# Patient Record
Sex: Female | Born: 2000 | Race: Black or African American | Hispanic: No | Marital: Single | Smoking: Current every day smoker
Health system: Southern US, Community
[De-identification: ages and names within clinical notes are randomized; demographics above are authoritative.]

## PROBLEM LIST (undated history)

## (undated) DIAGNOSIS — M419 Scoliosis, unspecified: Secondary | ICD-10-CM

## (undated) DIAGNOSIS — M1990 Unspecified osteoarthritis, unspecified site: Secondary | ICD-10-CM

## (undated) DIAGNOSIS — G43909 Migraine, unspecified, not intractable, without status migrainosus: Secondary | ICD-10-CM

## (undated) DIAGNOSIS — K589 Irritable bowel syndrome without diarrhea: Secondary | ICD-10-CM

## (undated) HISTORY — PX: FOOT SURGERY: SHX648

---
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
## 2018-04-13 DIAGNOSIS — B009 Herpesviral infection, unspecified: Secondary | ICD-10-CM | POA: Insufficient documentation

## 2018-05-12 ENCOUNTER — Emergency Department
Admission: EM | Admit: 2018-05-12 | Discharge: 2018-05-12 | Disposition: A | Discharge status: Home / Self Care | Payer: Medicaid Other | Attending: Emergency Medicine | Admitting: Emergency Medicine

## 2018-05-12 ENCOUNTER — Emergency Department: Payer: Medicaid Other

## 2018-05-12 ENCOUNTER — Other Ambulatory Visit: Payer: Self-pay

## 2018-05-12 ENCOUNTER — Encounter: Payer: Self-pay | Admitting: Emergency Medicine

## 2018-05-12 DIAGNOSIS — S63694A Other sprain of right ring finger, initial encounter: Secondary | ICD-10-CM | POA: Insufficient documentation

## 2018-05-12 DIAGNOSIS — Y9289 Other specified places as the place of occurrence of the external cause: Secondary | ICD-10-CM | POA: Insufficient documentation

## 2018-05-12 DIAGNOSIS — S63614A Unspecified sprain of right ring finger, initial encounter: Secondary | ICD-10-CM

## 2018-05-12 DIAGNOSIS — F17200 Nicotine dependence, unspecified, uncomplicated: Secondary | ICD-10-CM | POA: Insufficient documentation

## 2018-05-12 DIAGNOSIS — W231XXA Caught, crushed, jammed, or pinched between stationary objects, initial encounter: Secondary | ICD-10-CM | POA: Insufficient documentation

## 2018-05-12 DIAGNOSIS — Y999 Unspecified external cause status: Secondary | ICD-10-CM | POA: Insufficient documentation

## 2018-05-12 DIAGNOSIS — Y9389 Activity, other specified: Secondary | ICD-10-CM | POA: Insufficient documentation
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
## 2018-05-12 NOTE — ED Triage Notes (Signed)
Patient ambulatory to triage with steady gait, without difficulty or distress noted; pt reports injured rt 4th finger; c/o pain & swelling

## 2018-06-23 DIAGNOSIS — Z8669 Personal history of other diseases of the nervous system and sense organs: Secondary | ICD-10-CM | POA: Insufficient documentation

## 2018-06-23 LAB — HIV ANTIBODY (ROUTINE TESTING W REFLEX): HIV 1&2 Ab, 4th Generation: NONREACTIVE

## 2018-06-23 LAB — HM HEPATITIS C SCREENING LAB: HM HEPATITIS C SCREENING: NEGATIVE

## 2018-07-07 DIAGNOSIS — B009 Herpesviral infection, unspecified: Secondary | ICD-10-CM

## 2018-07-07 DIAGNOSIS — Z8669 Personal history of other diseases of the nervous system and sense organs: Secondary | ICD-10-CM

## 2018-09-19 ENCOUNTER — Other Ambulatory Visit: Payer: Self-pay

## 2018-09-19 ENCOUNTER — Emergency Department
Admission: EM | Admit: 2018-09-19 | Discharge: 2018-09-19 | Disposition: A | Discharge status: Home / Self Care | Payer: Medicaid Other | Attending: Emergency Medicine | Admitting: Emergency Medicine

## 2018-09-19 ENCOUNTER — Emergency Department: Payer: Medicaid Other

## 2018-09-19 ENCOUNTER — Encounter: Payer: Self-pay | Admitting: Emergency Medicine

## 2018-09-19 DIAGNOSIS — R05 Cough: Secondary | ICD-10-CM

## 2018-09-19 DIAGNOSIS — F17200 Nicotine dependence, unspecified, uncomplicated: Secondary | ICD-10-CM | POA: Insufficient documentation

## 2018-09-19 DIAGNOSIS — N3001 Acute cystitis with hematuria: Secondary | ICD-10-CM | POA: Insufficient documentation

## 2018-09-19 DIAGNOSIS — R059 Cough, unspecified: Secondary | ICD-10-CM

## 2018-09-19 LAB — URINALYSIS, COMPLETE (UACMP) WITH MICROSCOPIC
Bilirubin Urine: NEGATIVE
Glucose, UA: NEGATIVE mg/dL
Hgb urine dipstick: NEGATIVE
Ketones, ur: NEGATIVE mg/dL
Leukocytes,Ua: NEGATIVE
Nitrite: POSITIVE — AB
Protein, ur: 100 mg/dL — AB
Specific Gravity, Urine: 1.031 — ABNORMAL HIGH (ref 1.005–1.030)
pH: 5 (ref 5.0–8.0)

## 2018-09-19 LAB — POCT PREGNANCY, URINE: Preg Test, Ur: NEGATIVE

## 2018-09-19 MED ORDER — ONDANSETRON 4 MG PO TBDP: 4.0000 mg | ORAL_TABLET | Freq: Three times a day (TID) | ORAL | 0 refills | Status: AC | PRN

## 2018-09-19 MED ORDER — ONDANSETRON 4 MG PO TBDP
4.0000 mg | ORAL_TABLET | Freq: Once | ORAL | Status: AC
  Administered 2018-09-19: 17:00:00 4 mg via ORAL
  Filled 2018-09-19: qty 1

## 2018-09-19 MED ORDER — NITROFURANTOIN MONOHYD MACRO 100 MG PO CAPS
100.0000 mg | ORAL_CAPSULE | Freq: Once | ORAL | Status: AC
  Administered 2018-09-19: 18:00:00 100 mg via ORAL
  Filled 2018-09-19: qty 1

## 2018-09-19 MED ORDER — NITROFURANTOIN MONOHYD MACRO 100 MG PO CAPS: 100.0000 mg | ORAL_CAPSULE | Freq: Two times a day (BID) | ORAL | 0 refills | Status: AC

## 2018-09-19 NOTE — ED Triage Notes (Signed)
Patient arrives for new onset shortness of breath and sinus pressure. Patient states she becomes more SOB when she smokes cigarettes. She also states she has been having some generalized abdominal pain.

## 2018-09-19 NOTE — ED Provider Notes (Signed)
[facility] Emergency Department Provider Note  ____________________________________________  Time seen: Approximately 5:17 PM  I have reviewed the triage vital signs and the nursing notes.   HISTORY  Chief Complaint Shortness of Breath   HPI Brianna Henderson is a 18 y.o. female with a history of IBS who presents for evaluation of several medical complaints.  Patient reports that for the last 2 days she has had a cough productive of clear sputum that is only present when she smokes.  Today she walked to work and was sweating a lot which made her concerned.  Also complaining of nausea that is only present when she eats for the last 2 days.  She is also concerned that she may be pregnant since she had sex 1 time after her menstrual period with no protection.  She is also complaining of dysuria for a few days. She has had no fever, chills, chest pain or shortness of breath, abdominal pain, vaginal discharge, diarrhea.  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 Authorizing Provider  dicyclomine (BENTYL) 20 MG tablet Take 1 tablet (20 mg total) by mouth 3 (three) times daily as needed for spasms. 12/14/17 12/14/18  Darci Current, MD  nitrofurantoin, macrocrystal-monohydrate, (MACROBID) 100 MG capsule Take 1 capsule (100 mg total) by mouth 2 (two) times daily for 5 days. 09/19/18 09/24/18  Nita Sickle, MD  ondansetron (ZOFRAN ODT) 4 MG disintegrating tablet Take 1 tablet (4 mg total) by mouth every 8 (eight) hours as  needed. 09/19/18   Nita Sickle, MD    Allergies Clindamycin/lincomycin and Penicillins  History reviewed. No pertinent family history.  Social History Social History   Tobacco Use   Smoking status: Current Every Day Smoker    Packs/day: 0.25   Smokeless tobacco: Never Used  Substance Use Topics   Alcohol use: Yes   Drug use: No    Review of Systems  Constitutional: Negative for fever. Eyes: Negative for visual changes. ENT: Negative for sore throat. Neck: No neck pain  Cardiovascular: Negative for chest pain. Respiratory: Negative for shortness of breath. + cough Gastrointestinal: Negative for abdominal pain, vomiting or diarrhea. + nausea Genitourinary: Negative for dysuria. Musculoskeletal: Negative for back pain. Skin: Negative for rash. Neurological: Negative for headaches, weakness or numbness. Psych: No SI or HI  ____________________________________________   PHYSICAL EXAM:  VITAL SIGNS: ED Triage Vitals  Enc Vitals Group     BP 09/19/18 1650 (!) 149/98     Pulse Rate 09/19/18 1650 85     Resp 09/19/18 1650 18     Temp 09/19/18 1650 98.3 F (36.8 C)     Temp Source 09/19/18 1650 Oral     SpO2 09/19/18 1650 100 %     Weight 09/19/18 1651 230 lb (104.3 kg)     Height 09/19/18 1651  (1.803 m)     Head Circumference --      Peak Flow --      Pain Score 09/19/18  1650 0     Pain Loc --      Pain Edu? --      Excl. in GC? --     Constitutional: Alert and oriented. Well appearing and in no apparent distress. HEENT:      Head: Normocephalic and atraumatic.         Eyes: Conjunctivae are normal. Sclera is non-icteric.       Mouth/Throat: Mucous membranes are moist.       Neck: Supple with no signs of meningismus. Cardiovascular: Regular rate and rhythm. No murmurs, gallops, or rubs. 2+ symmetrical distal pulses are present in all extremities. No JVD. Respiratory: Normal respiratory effort. Lungs are clear to auscultation bilaterally. No  wheezes, crackles, or rhonchi.  Gastrointestinal: Soft, non tender, and non distended with positive bowel sounds. No rebound or guarding. Musculoskeletal: Nontender with normal range of motion in all extremities. No edema, cyanosis, or erythema of extremities. Neurologic: Normal speech and language. Face is symmetric. Moving all extremities. No gross focal neurologic deficits are appreciated. Skin: Skin is warm, dry and intact. No rash noted. Psychiatric: Mood and affect are normal. Speech and behavior are normal.  ____________________________________________   LABS (all labs ordered are listed, but only abnormal results are displayed)  Labs Reviewed  URINALYSIS, COMPLETE (UACMP) WITH MICROSCOPIC - Abnormal; Notable for the following components:      Result Value   Color, Urine AMBER (*)    APPearance HAZY (*)    Specific Gravity, Urine 1.031 (*)    Protein, ur 100 (*)    Nitrite POSITIVE (*)    Bacteria, UA FEW (*)    All other components within normal limits  URINE CULTURE  POCT PREGNANCY, URINE   ____________________________________________  EKG  none  ____________________________________________  RADIOLOGY  I have personally reviewed the images performed during this visit and I agree with the Radiologist's read.   Interpretation by Radiologist:  Dg Chest 1 View  Result Date: 09/19/2018 CLINICAL DATA:  Cough and shortness of breath. EXAM: CHEST  1 VIEW COMPARISON:  None. FINDINGS: The heart size and mediastinal contours are within normal limits. Both lungs are clear. The visualized skeletal structures are unremarkable. IMPRESSION: No active disease. Electronically Signed   By: Obie Dredge M.D.   On: 09/19/2018 17:40      ____________________________________________   PROCEDURES  Procedure(s) performed: None Procedures Critical Care performed:  None ____________________________________________   INITIAL IMPRESSION / ASSESSMENT AND PLAN / ED COURSE   18  y.o. female with a history of IBS who presents for evaluation of several medical complaints including cough when she smokes, nausea, dysuria.  Patient is extremely well-appearing in no distress with normal vitals, normal work of breathing, lungs are clear to auscultation, abdomen is soft with no tenderness, patient looks well-hydrated.  We will do a chest x-ray to rule out pneumonia.  UA to rule out UTI.  Pregnancy test.  Will give Zofran for nausea.  Presentation concerning for viral illness versus cough related to smoking versus allergies versus bronchitis versus pneumonia.    _________________________ 5:52 PM on 09/19/2018 -----------------------------------------  UA positive for UTI.  Negative pregnancy test.  Chest x-ray negative for pneumonia.  Patient is tolerating p.o.  Will discharge home on Macrobid and Zofran.  Discussed my standard return precautions and follow-up.   As part of my medical decision making, I reviewed the following data within the electronic MEDICAL RECORD NUMBER Nursing notes reviewed and incorporated, Labs reviewed , Notes from prior ED visits and  [place] Controlled Substance Database    Pertinent labs & imaging results that were available during my care of the patient were reviewed by me and considered in my medical decision making (see chart for details).    ____________________________________________   FINAL CLINICAL IMPRESSION(S) / ED DIAGNOSES  Final diagnoses:  Acute cystitis with hematuria      NEW MEDICATIONS STARTED DURING THIS VISIT:  ED Discharge Orders         Ordered    ondansetron (ZOFRAN ODT) 4 MG disintegrating tablet  Every 8 hours PRN     09/19/18 1751    nitrofurantoin, macrocrystal-monohydrate, (MACROBID) 100 MG capsule  2 times daily     09/19/18 1751           Note:  This document was prepared using Dragon voice recognition software and may include unintentional dictation errors.    Don PerkingVeronese, [place], MD 09/19/18 [phone]

## 2018-09-19 NOTE — Discharge Instructions (Addendum)

## 2018-09-21 LAB — URINE CULTURE: Culture: >=100000 — AB

## 2018-10-12 ENCOUNTER — Encounter: Payer: Self-pay | Admitting: Emergency Medicine

## 2018-10-12 ENCOUNTER — Other Ambulatory Visit: Payer: Self-pay

## 2018-10-12 ENCOUNTER — Emergency Department
Admission: EM | Admit: 2018-10-12 | Discharge: 2018-10-13 | Disposition: A | Discharge status: Home / Self Care | Payer: HRSA Program | Attending: Emergency Medicine | Admitting: Emergency Medicine

## 2018-10-12 DIAGNOSIS — R05 Cough: Secondary | ICD-10-CM | POA: Diagnosis present

## 2018-10-12 DIAGNOSIS — F17200 Nicotine dependence, unspecified, uncomplicated: Secondary | ICD-10-CM | POA: Diagnosis not present

## 2018-10-12 DIAGNOSIS — Z20828 Contact with and (suspected) exposure to other viral communicable diseases: Secondary | ICD-10-CM | POA: Diagnosis not present

## 2018-10-12 DIAGNOSIS — J069 Acute upper respiratory infection, unspecified: Secondary | ICD-10-CM | POA: Insufficient documentation

## 2018-10-12 NOTE — ED Triage Notes (Addendum)
Patient ambulatory to triage with steady gait, without difficulty or distress noted; pt reports prod cough white sputum & congestion x week--mask placed on pt

## 2018-10-12 NOTE — ED Notes (Signed)
Patient states she smokes marijuana at least 3x per day, and she also has allergies

## 2018-10-13 LAB — SARS CORONAVIRUS 2 BY RT PCR (HOSPITAL ORDER, PERFORMED IN ~~LOC~~ HOSPITAL LAB): SARS Coronavirus 2: NEGATIVE

## 2018-10-13 NOTE — ED Provider Notes (Signed)
[facility] Emergency Department Provider Note   First MD Initiated Contact with Patient 10/13/18 0000     (approximate)  I have reviewed the triage vital signs and the nursing notes.   HISTORY  Chief Complaint Cough    HPI Brianna Henderson is a 18 y.o. female with below list of previous medical conditions presents to the emergency department with 1 week history of generalized body aches fever, productive cough with congestion.  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       Past Medical History:  Diagnosis Date  . Degenerative joint disease   . IBS (irritable bowel syndrome)   . Migraine   . Scoliosis     Patient Active Problem List   Diagnosis Date Noted  . 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 FOOT SURGERY      Prior to Admission medications   Medication Sig Start Date End Date Taking? Authorizing Provider  dicyclomine (BENTYL) 20 MG tablet Take 1 tablet (20 mg total) by mouth 3 (three) times daily as needed for spasms. 12/14/17 12/14/18  Darci CurrentBrown, [place] N, MD  ondansetron (ZOFRAN ODT) 4 MG disintegrating tablet Take 1 tablet (4 mg total) by mouth every 8 (eight) hours as needed. 09/19/18   Nita SickleVeronese, [place], MD    Allergies Clindamycin/lincomycin and Penicillins  No family history on file.  Social History Social History   Tobacco Use  . Smoking status: Current Every Day Smoker    Packs/day: 0.25  . Smokeless tobacco: Never Used  Substance Use Topics  . Alcohol use: Yes  . Drug use: No    Review of Systems Constitutional: No fever/chills Eyes: No visual changes. ENT: No sore throat.  Positive for nasal congestion Cardiovascular:  Denies chest pain. Respiratory: Denies shortness of breath.  Positive cough Gastrointestinal: No abdominal pain.  No nausea, no vomiting.  No diarrhea.  No constipation. Genitourinary: Negative for dysuria. Musculoskeletal: Negative for neck pain.  Negative for back pain. Integumentary: Negative for rash. Neurological: Negative for headaches, focal weakness or numbness.   ____________________________________________   PHYSICAL EXAM:  VITAL SIGNS: ED Triage Vitals  Enc Vitals Group     BP 10/12/18 2325 127/74     Pulse Rate 10/12/18 2325 62     Resp 10/12/18 2325 16     Temp 10/12/18 2325 98.2 F (36.8 C)     Temp Source 10/12/18 2325 Oral     SpO2 10/12/18 2325 99 %     Weight 10/12/18 2324 102.5 kg (226 lb)     Height 10/12/18 2324 1.829 m (6')     Head Circumference --      Peak Flow --      Pain Score 10/12/18 2326 0     Pain Loc --      Pain Edu? --      Excl. in GC? --      Constitutional: Alert and oriented. Well appearing and in no acute distress. Eyes: Conjunctivae are normal.  Head: Atraumatic. Nose: No congestion/rhinnorhea. Mouth/Throat: Mucous membranes are moist.  Oropharynx non-erythematous. Neck: No stridor.  No meningeal signs.   Cardiovascular: Normal rate, regular rhythm. Good peripheral circulation. Grossly normal heart sounds. Respiratory:  Normal respiratory effort.  No retractions. No audible wheezing. Gastrointestinal: Soft and nontender. No distention. Musculoskeletal: No lower extremity tenderness nor edema. No gross deformities of extremities. Neurologic:  Normal speech and language. No gross focal neurologic deficits are appreciated.  Skin:  Skin is warm, dry and intact. No rash noted. Psychiatric: Mood and affect are normal. Speech and behavior are normal.  ____________________________________________   LABS (all labs ordered are listed, but only abnormal results are displayed)  Labs Reviewed  SARS CORONAVIRUS 2 (HOSPITAL ORDER,  PERFORMED IN [place] [facility])     Procedures   ____________________________________________   INITIAL IMPRESSION / MDM / ASSESSMENT AND PLAN / ED COURSE  As part of my medical decision making, I reviewed the following data within the electronic MEDICAL RECORD NUMBER   18 year old female presenting with above-stated history and physical exam concerning for possible URI however considered possibly of COVID-19 in such a take patient was tested in the emergency department.  COVID-19 testing negative.       ____________________________________________  FINAL CLINICAL IMPRESSION(S) / ED DIAGNOSES  Final diagnoses:  Upper respiratory tract infection, unspecified type     MEDICATIONS GIVEN DURING THIS VISIT:  Medications - No data to display   ED Discharge Orders    None       Note:  This document was prepared using Dragon voice recognition software and may include unintentional dictation errors.   Darci Current, MD 10/13/18 [phone]

## 2018-11-08 ENCOUNTER — Ambulatory Visit: Payer: Self-pay | Admitting: *Deleted

## 2018-11-08 NOTE — Telephone Encounter (Signed)
Patient with additional menstrual bleeding just 7 days after her normal light and short lasting cycle. She has been bleeding more heavily nad with abdominal cramping and back aches for 7 days now. Had unprotected sex  and took Plan B within 72 hours then started bleeding again on 4 days later. Denies dizziness. No contact reported no travels. No fever. No PCP and no insurance. Recommended appointment or ED visit today. Stated she would.   Reason for Disposition . MODERATE vaginal bleeding (i.e., soaking 1 pad or tampon per hour and present > 6 hours; 1 menstrual cup every 6 hours)  Answer Assessment - Initial Assessment Questions 1. AMOUNT: "Describe the bleeding that you are having."    - SPOTTING: spotting, or pinkish / brownish mucous discharge; does not fill panti-liner or pad   - MILD:  less than 1 pad / hour; less than patient's usual menstrual bleeding   - MODERATE: 1-2 pads / hour; 1 menstrual cup every 6 hours; small-medium blood clots (e.g., pea, grape, small coin)   - SEVERE: soaking 2 or more pads/hour for 2 or more hours; 1 menstrual cup every 2 hours; bleeding not contained by pads or continuous red blood from vagina; large blood clots (e.g., golf ball, large coin)      Dark red bleeding. Changing 3x daily pads 2. ONSET: "When did the bleeding begin?" "Is it continuing now?"     Started on 6/4 and continues now 3. MENSTRUAL PERIOD: "When was the last normal menstrual period?" "How is this different than your period?"     One week before this bleeding started.Stopped on a Thursday/Friday then started back the next Thursday. 4. REGULARITY: "How regular are your periods?"     Normally, light, and last only 4 days  5. ABDOMINAL PAIN: "Do you have any pain?" "How bad is the pain?"  (e.g., Scale 1-10; mild, moderate, or severe)   - MILD (1-3): doesn't interfere with normal activities, abdomen soft and not tender to touch    - MODERATE (4-7): interferes with normal activities or awakens  from sleep, tender to touch    - SEVERE (8-10): excruciating pain, doubled over, unable to do any normal activities      5-6 with cramping and back aches 6. PREGNANCY: "Could you be pregnant?" "Are you sexually active?" "Did you recently give birth?"     Negative pregnancy test around 6/6. Took plan B 7. BREASTFEEDING: "Are you breastfeeding?"     na 8. HORMONES: "Are you taking any hormone medications, prescription or OTC?" (e.g., birth control pills, estrogen)     no 9. BLOOD THINNERS: "Do you take any blood thinners?" (e.g., Coumadin/warfarin, Pradaxa/dabigatran, aspirin)     no 10. CAUSE: "What do you think is causing the bleeding?" (e.g., recent gyn surgery, recent gyn procedure; known bleeding disorder, cervical cancer, polycystic ovarian disease, fibroids)        No idea 11. HEMODYNAMIC STATUS: "Are you weak or feeling lightheaded?" If so, ask: "Can you stand and walk normally?"        no 12. OTHER SYMPTOMS: "What other symptoms are you having with the bleeding?" (e.g., passed tissue, vaginal discharge, fever, menstrual-type cramps)       cramps  Protocols used: VAGINAL BLEEDING - ABNORMAL-A-AH

## 2018-11-09 ENCOUNTER — Emergency Department
Admission: EM | Admit: 2018-11-09 | Discharge: 2018-11-10 | Disposition: A | Discharge status: Home / Self Care | Payer: Medicaid Other | Attending: Emergency Medicine | Admitting: Emergency Medicine

## 2018-11-09 ENCOUNTER — Encounter: Payer: Self-pay | Admitting: Emergency Medicine

## 2018-11-09 ENCOUNTER — Emergency Department: Payer: Medicaid Other

## 2018-11-09 DIAGNOSIS — Y999 Unspecified external cause status: Secondary | ICD-10-CM | POA: Insufficient documentation

## 2018-11-09 DIAGNOSIS — S90852A Superficial foreign body, left foot, initial encounter: Secondary | ICD-10-CM | POA: Insufficient documentation

## 2018-11-09 DIAGNOSIS — Y929 Unspecified place or not applicable: Secondary | ICD-10-CM | POA: Insufficient documentation

## 2018-11-09 DIAGNOSIS — Y9389 Activity, other specified: Secondary | ICD-10-CM | POA: Insufficient documentation

## 2018-11-09 DIAGNOSIS — Z23 Encounter for immunization: Secondary | ICD-10-CM | POA: Insufficient documentation

## 2018-11-09 DIAGNOSIS — Z79899 Other long term (current) drug therapy: Secondary | ICD-10-CM | POA: Insufficient documentation

## 2018-11-09 DIAGNOSIS — F17210 Nicotine dependence, cigarettes, uncomplicated: Secondary | ICD-10-CM | POA: Insufficient documentation

## 2018-11-09 DIAGNOSIS — W458XXA Other foreign body or object entering through skin, initial encounter: Secondary | ICD-10-CM | POA: Insufficient documentation

## 2018-11-09 MED ORDER — TETANUS-DIPHTH-ACELL PERTUSSIS 5-2.5-18.5 LF-MCG/0.5 IM SUSP
0.5000 mL | Freq: Once | INTRAMUSCULAR | Status: AC
  Administered 2018-11-09: 0.5 mL via INTRAMUSCULAR

## 2018-11-09 MED ORDER — OXYCODONE HCL 5 MG PO TABS
10.0000 mg | ORAL_TABLET | Freq: Once | ORAL | Status: AC
  Administered 2018-11-09: 23:00:00 10 mg via ORAL
  Filled 2018-11-09: qty 2

## 2018-11-09 MED ORDER — LIDOCAINE HCL (PF) 1 % IJ SOLN
5.0000 mL | Freq: Once | INTRAMUSCULAR | Status: AC
  Administered 2018-11-09: 5 mL via INTRADERMAL
  Filled 2018-11-09: qty 5

## 2018-11-09 NOTE — ED Provider Notes (Signed)
[facility] Emergency Department Provider Note  ____________________________________________  Time seen: Approximately 10:38 PM  I have reviewed the triage vital signs and the nursing notes.   HISTORY  Chief Complaint Foot Pain   HPI Brianna Henderson is a 18 y.o. female who presents to the ER for removal of foreign body from the left foot.  Patient states that she had a pair tweezers in her back pocket and when she took her pants off to put her pajamas on, the tweezers fell out of the pocket and she stepped back and jammed the tweezers in her foot.  Tdap is not current.  Past Medical History:  Diagnosis Date  . Degenerative joint disease   . IBS (irritable bowel syndrome)   . Migraine   . Scoliosis     Patient Active Problem List   Diagnosis Date Noted  . 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 FOOT SURGERY      Prior to Admission medications   Medication Sig Start Date End Date Taking? Authorizing Provider  dicyclomine (BENTYL) 20 MG tablet Take 1 tablet (20 mg total) by mouth 3 (three) times daily as needed for spasms. 12/14/17 12/14/18  Gregor Hams, MD  ondansetron (ZOFRAN ODT) 4 MG disintegrating tablet Take 1 tablet (4 mg total) by mouth every 8 (eight) hours as needed. 09/19/18   Rudene Re, MD    Allergies Clindamycin/lincomycin and Penicillins  History reviewed. No pertinent family history.  Social History Social History   Tobacco Use  . Smoking status: Current Every Day Smoker    Packs/day: 0.25  . Smokeless tobacco: Never Used  Substance Use Topics  . Alcohol use: Yes  . Drug use: No    Review of Systems  Constitutional: Negative for fever. Respiratory: Negative for cough or shortness of breath.  Musculoskeletal: Negative for myalgias Skin: Positive for foreign body in the left foot Neurological: Negative for  numbness or paresthesias. ____________________________________________   PHYSICAL EXAM:  VITAL SIGNS: ED Triage Vitals [11/09/18 2209]  Enc Vitals Group     BP (!) 149/89     Pulse Rate (!) 110     Resp 20     Temp 98 F (36.7 C)     Temp Source Oral     SpO2 99 %     Weight      Height      Head Circumference      Peak Flow      Pain Score      Pain Loc      Pain Edu?      Excl. in [place]?      Constitutional: Well appearing. Eyes: Conjunctivae are clear without discharge or drainage. Nose: No rhinorrhea noted. Mouth/Throat: Airway is patent.  Neck: No stridor. Unrestricted range of motion observed. Cardiovascular: Capillary refill is <3 seconds.  Respiratory: Respirations are even and unlabored.. Musculoskeletal: Unrestricted range of motion observed. Neurologic: Awake, alert, and oriented x 4.  Skin: Tweezers protruding from the bottom of the left foot  ____________________________________________   LABS (all labs ordered are listed, but only abnormal results are displayed)  Labs Reviewed - No data to display ____________________________________________  EKG  Not indicated. ____________________________________________  RADIOLOGY  Image of the left foot shows no acute bony abnormality.  There is a metallic foreign body projecting over the calcaneus. ____________________________________________   PROCEDURES  .Foreign Body Removal  Date/Time: 11/10/2018  1:08 AM Performed by: Chinita Pesterriplett, Chrystle Murillo B, FNP Authorized by: Chinita Pesterriplett, Preslyn Warr B, FNP  Consent: Verbal consent obtained. Consent given by: patient Patient understanding: patient states understanding of the procedure being performed Imaging studies: imaging studies available Body area: skin General location: lower extremity Location details: left foot Anesthesia: local infiltration  Anesthesia: Local Anesthetic: lidocaine 1% without epinephrine Anesthetic total: 4 mL Localization method:  visualized Dressing: dressing applied Depth: deep Complexity: simple 1 objects recovered. Objects recovered: Tweezers Post-procedure assessment: foreign body removed   ____________________________________________   INITIAL IMPRESSION / ASSESSMENT AND PLAN / ED COURSE  Brianna Henderson is a 18 y.o. female who presents to the emergency department for removal of tweezers from the bottom of her left foot.  See above for procedure details.  Patient tolerated the procedure well.  She was given ER return precautions.  Wound care instructions were also discussed.   Medications  lidocaine (PF) (XYLOCAINE) 1 % injection 5 mL (5 mLs Intradermal Given 11/09/18 2258)  oxyCODONE (Oxy IR/ROXICODONE) immediate release tablet 10 mg (10 mg Oral Given 11/09/18 2257)  Tdap (BOOSTRIX) injection 0.5 mL (0.5 mLs Intramuscular Given 11/09/18 2350)     Pertinent labs & imaging results that were available during my care of the patient were reviewed by me and considered in my medical decision making (see chart for details).  ____________________________________________   FINAL CLINICAL IMPRESSION(S) / ED DIAGNOSES  Final diagnoses:  Foreign body in left foot, initial encounter    ED Discharge Orders    None       Note:  This document was prepared using Dragon voice recognition software and may include unintentional dictation errors.   Chinita Pesterriplett, Jameon Deller B, FNP 11/10/18 0110    Jeanmarie PlantMcShane, James A, MD 11/10/18 [phone]

## 2018-11-09 NOTE — ED Triage Notes (Addendum)
Pt with tweezers stuck in the left foot.

## 2018-11-09 NOTE — Discharge Instructions (Signed)
Keep the wound clean and dry.  Use soap and water to clean it then apply antibiotic ointment 2 times per day.  Return to the ER for sign or concern of infection.

## 2018-12-11 ENCOUNTER — Encounter: Payer: Self-pay | Admitting: Physician Assistant

## 2019-01-04 ENCOUNTER — Other Ambulatory Visit: Payer: Self-pay

## 2019-01-04 ENCOUNTER — Ambulatory Visit: Payer: Medicaid Other

## 2019-01-04 ENCOUNTER — Encounter: Payer: Self-pay | Admitting: Physician Assistant

## 2019-01-04 ENCOUNTER — Ambulatory Visit (LOCAL_COMMUNITY_HEALTH_CENTER): Payer: Medicaid Other | Admitting: Physician Assistant

## 2019-01-04 VITALS — BP 97/66 | Ht 70.0 in | Wt 215.8 lb

## 2019-01-04 DIAGNOSIS — Z3009 Encounter for other general counseling and advice on contraception: Secondary | ICD-10-CM | POA: Diagnosis not present

## 2019-01-04 LAB — WET PREP FOR TRICH, YEAST, CLUE
Trichomonas Exam: NEGATIVE
Yeast Exam: NEGATIVE

## 2019-01-04 MED ORDER — ETONOGESTREL-ETHINYL ESTRADIOL 0.12-0.015 MG/24HR VA RING: VAGINAL_RING | VAGINAL | 3 refills | Status: AC

## 2019-01-04 NOTE — Progress Notes (Signed)
Family Planning Visit- Repeat Yearly Visit  Subjective:  Brianna Henderson is a 18 y.o. being seen today for an well woman visit and to discuss family planning options.    She is currently using none for pregnancy prevention. Patient reports she does not want a pregnancy in the next year. Patient  has History of migraine with aura and HSV-2 infection on their problem list. Smokes 1-2 cig per day, and not ready to quit. No STI sx, but just ended a relationship and wants STD screening today.  Chief Complaint  Patient presents with  . Contraception    Wants Teacher, early years/pre    See flowsheet for other program required questions.   Body mass index is 30.96 kg/m. - Patient is eligible for diabetes screening based on BMI and age >78?  not applicable LF8B ordered? not applicable  Patient reports 1 of partners in last year. Desires STI screening?  Yes  Does the patient have a current or past history of drug use? No, aside from occ marijuana.   No components found for: HCV]   Health Maintenance Due  Topic Date Due  . HIV Screening  09/27/2013  . TETANUS/TDAP  09/27/2017  . INFLUENZA VACCINE  12/30/2018    Review of Systems  Constitutional: Negative.   Eyes: Negative.   Respiratory: Negative.   Cardiovascular: Negative.   Genitourinary: Negative.   Skin: Negative.   Neurological: Negative.   Psychiatric/Behavioral: Negative.     The following portions of the patient's history were reviewed and updated as appropriate: allergies, current medications, past family history, past medical history, past social history, past surgical history and problem list. Problem list updated.  Objective:   Vitals:   01/04/19 1416  BP: 97/66  Weight: 215 lb 12.8 oz (97.9 kg)  Height: 5\' 10"  (1.778 m)    Physical Exam Constitutional:      Appearance: Normal appearance.  Genitourinary:    General: Normal vulva.     Rectum: Normal.  Skin:    General: Skin is warm and dry.  Neurological:    Mental Status: She is alert.  Psychiatric:        Mood and Affect: Mood normal.        Thought Content: Thought content normal.       Assessment and Plan:  Brianna Henderson is a 18 y.o. female presenting to the [facility] Department for an initial well woman exam/family planning visit.   Contraception counseling: Reviewed all forms of birth control options available including abstinence; over the counter/barrier methods; hormonal contraceptive medication including pill, patch, ring, injection,contraceptive implant; hormonal and nonhormonal IUDs; permanent sterilization options including vasectomy and the various tubal sterilization modalities. Risks and benefits reviewed.  Questions were answered.  Written information was also given to the patient to review.  Patient desires NuvaRing, this was prescribed for patient. She will follow up in  11 mo for surveillance.  She was told to call with any further questions, or with any concerns about this method of contraception.  Emphasized use of condoms 100% of the time for STI prevention.  1. Family planning services Extensive discussion of BCM options; had headaches that she identifies with migraines until she discontinued Nexplanon last fall. No HA since. Counseled re: theoretical inc risk of headaches and CVA in pt with hx migraine and tobacco use; pt states understanding of risks and requests NuvaRing. - WET PREP FOR [place], YEAST, CLUE - HIV [facility] - Syphilis Serology, [facility] -  Chlamydia/Gonorrhea [facility]     Return in about 11 months (around 12/04/2019) for annual well-woman exam, Pap and f/u contraception.  No future appointments.  Landry DykeAnnamarie Kostas Marrow, PA-C

## 2019-01-04 NOTE — Progress Notes (Signed)
Here today for Contraception and STD testing. Would like to discuss Nuva Ring for birth control. Hal Morales, RN

## 2019-01-05 ENCOUNTER — Ambulatory Visit: Payer: Medicaid Other

## 2019-02-01 ENCOUNTER — Other Ambulatory Visit: Payer: Self-pay

## 2019-02-01 ENCOUNTER — Ambulatory Visit: Payer: Medicaid Other

## 2019-02-01 DIAGNOSIS — Z20822 Contact with and (suspected) exposure to covid-19: Secondary | ICD-10-CM

## 2019-02-02 LAB — NOVEL CORONAVIRUS, NAA: SARS-CoV-2, NAA: NOT DETECTED

## 2019-02-08 ENCOUNTER — Encounter: Payer: Self-pay | Admitting: Emergency Medicine

## 2019-02-08 ENCOUNTER — Emergency Department: Payer: Medicaid Other

## 2019-02-08 ENCOUNTER — Other Ambulatory Visit: Payer: Self-pay

## 2019-02-08 DIAGNOSIS — N6012 Diffuse cystic mastopathy of left breast: Secondary | ICD-10-CM | POA: Insufficient documentation

## 2019-02-08 DIAGNOSIS — Z79899 Other long term (current) drug therapy: Secondary | ICD-10-CM | POA: Insufficient documentation

## 2019-02-08 DIAGNOSIS — F17200 Nicotine dependence, unspecified, uncomplicated: Secondary | ICD-10-CM | POA: Insufficient documentation

## 2019-02-08 LAB — BASIC METABOLIC PANEL
Anion gap: 9 (ref 5–15)
BUN: 11 mg/dL (ref 6–20)
CO2: 25 mmol/L (ref 22–32)
Calcium: 9.3 mg/dL (ref 8.9–10.3)
Chloride: 105 mmol/L (ref 98–111)
Creatinine, Ser: 0.91 mg/dL (ref 0.44–1.00)
GFR calc Af Amer: >60 mL/min (ref >60)
GFR calc non Af Amer: >60 mL/min (ref >60)
Glucose, Bld: 96 mg/dL (ref 70–99)
Potassium: 3.7 mmol/L (ref 3.5–5.1)
Sodium: 139 mmol/L (ref 135–145)

## 2019-02-08 LAB — POCT PREGNANCY, URINE: Preg Test, Ur: NEGATIVE

## 2019-02-08 LAB — CBC
HCT: 35.1 % — ABNORMAL LOW (ref 36.0–46.0)
Hemoglobin: 11.7 g/dL — ABNORMAL LOW (ref 12.0–15.0)
MCH: 29 pg (ref 26.0–34.0)
MCHC: 33.3 g/dL (ref 30.0–36.0)
MCV: 87.1 fL (ref 80.0–100.0)
Platelets: 206 10*3/uL (ref 150–400)
RBC: 4.03 MIL/uL (ref 3.87–5.11)
RDW: 15.1 % (ref 11.5–15.5)
WBC: 5.3 10*3/uL (ref 4.0–10.5)
nRBC: 0 % (ref 0.0–0.2)

## 2019-02-08 LAB — TROPONIN I (HIGH SENSITIVITY): Troponin I (High Sensitivity): <2 ng/L (ref <18)

## 2019-02-08 NOTE — ED Triage Notes (Signed)
Patient in via POV, reports intermittent chest pain with radiation to left shoulder x 4-5 days, denies any associated symptoms, NAD noted at this time.

## 2019-02-09 ENCOUNTER — Emergency Department
Admission: EM | Admit: 2019-02-09 | Discharge: 2019-02-09 | Disposition: A | Discharge status: Home / Self Care | Payer: Medicaid Other | Attending: Emergency Medicine | Admitting: Emergency Medicine

## 2019-02-09 DIAGNOSIS — N6012 Diffuse cystic mastopathy of left breast: Secondary | ICD-10-CM

## 2019-02-09 DIAGNOSIS — N644 Mastodynia: Secondary | ICD-10-CM

## 2019-02-09 MED ORDER — IBUPROFEN 800 MG PO TABS: 800.0000 mg | ORAL_TABLET | Freq: Three times a day (TID) | ORAL | 0 refills | Status: AC | PRN

## 2019-02-09 NOTE — ED Provider Notes (Signed)
[facility] Emergency Department Provider Note    First MD Initiated Contact with Patient 02/09/19 0319     (approximate)  I have reviewed the triage vital signs and the nursing notes.   HISTORY  Chief Complaint Chest Pain   HPI Brianna Henderson is a 18 y.o. female with below list of previous medical conditions presents to the emergency department secondary to midline chest discomfort with "tender knots on my breast" x5 days.  Patient denies any dyspnea no nausea or vomiting.  Patient denies any diaphoresis.  Patient denies any lower extremity pain or swelling.  Patient states that she is noted the tender areas on her breast for a "long time".  Pain is improved with aspirin        Past Medical History:  Diagnosis Date  . Degenerative joint disease   . IBS (irritable bowel syndrome)   . Migraine   . Scoliosis     Patient Active Problem List   Diagnosis Date Noted  . 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 FOOT SURGERY      Prior to Admission medications   Medication Sig Start Date End Date Taking? Authorizing Provider  dicyclomine (BENTYL) 20 MG tablet Take 1 tablet (20 mg total) by mouth 3 (three) times daily as needed for spasms. 12/14/17 12/14/18  Gregor Hams, MD  etonogestrel-ethinyl estradiol (NUVARING) 0.12-0.015 MG/24HR vaginal ring Insert vaginally and leave in place for 3 consecutive weeks, then remove for 1 week. 01/04/19   Streilein, Annamarie, PA-C  ondansetron (ZOFRAN ODT) 4 MG disintegrating tablet Take 1 tablet (4 mg total) by mouth every 8 (eight) hours as needed. 09/19/18   Rudene Re, MD    Allergies Clindamycin/lincomycin and Penicillins  No family history on file.  Social History Social History   Tobacco Use  . Smoking status: Current Every Day Smoker    Packs/day: 0.10  . Smokeless tobacco: Never Used   Substance Use Topics  . Alcohol use: Yes  . Drug use: Yes    Types: Marijuana    Review of Systems Constitutional: No fever/chills Eyes: No visual changes. ENT: No sore throat. Cardiovascular: Positive for chest/breast pain Respiratory: Denies shortness of breath. Gastrointestinal: No abdominal pain.  No nausea, no vomiting.  No diarrhea.  No constipation. Genitourinary: Negative for dysuria. Musculoskeletal: Negative for neck pain.  Negative for back pain. Integumentary: Negative for rash. Neurological: Negative for headaches, focal weakness or numbness.  ____________________________________________   PHYSICAL EXAM:  VITAL SIGNS: ED Triage Vitals [02/08/19 2035]  Enc Vitals Group     BP 138/73     Pulse Rate 71     Resp 16     Temp 99.1 F (37.3 C)     Temp Source Oral     SpO2 98 %     Weight 95.3 kg (210 lb)     Height 1.829 m (6')     Head Circumference      Peak Flow      Pain Score 8     Pain Loc      Pain Edu?      Excl. in [place]?     Constitutional: Alert and oriented.  Eyes: Conjunctivae are normal.  Mouth/Throat: Mucous membranes are moist. Neck: No stridor.  No meningeal signs.   Breast: Multiple tender palpable mobile nodular mass noted throughout the breasts.  (Breast exam was performed with a  female nurse at bedside) Cardiovascular: Normal rate, regular rhythm. Good peripheral circulation. Grossly normal heart sounds. Respiratory: Normal respiratory effort.  No retractions. Gastrointestinal: Soft and nontender. No distention.  Musculoskeletal: No lower extremity tenderness nor edema. No gross deformities of extremities. Neurologic:  Normal speech and language. No gross focal neurologic deficits are appreciated.  Skin:  Skin is warm, dry and intact. Psychiatric: Mood and affect are normal. Speech and behavior are normal.  ____________________________________________   LABS (all labs ordered are listed, but only abnormal results are displayed)   Labs Reviewed  CBC - Abnormal; Notable for the following components:      Result Value   Hemoglobin 11.7 (*)    HCT 35.1 (*)    All other components within normal limits  BASIC METABOLIC PANEL  POC URINE PREG, ED  POCT PREGNANCY, URINE  TROPONIN I (HIGH SENSITIVITY)   ____________________________________________  EKG  ED ECG REPORT I, [place] N Emileo Semel, the attending physician, personally viewed and interpreted this ECG.   Date: 02/08/2019  EKG Time: 8:28 PM  Rate: 68  Rhythm: Normal sinus rhythm  Axis: Normal  Intervals: Normal  ST&T Change: None  ____________________________________________  RADIOLOGY I, [place] N Kento Gossman, personally viewed and evaluated these images (plain radiographs) as part of my medical decision making, as well as reviewing the written report by the radiologist.  ED MD interpretation: No acute abnormality noted on chest x-ray per radiologist  Official radiology report(s): Dg Chest 2 View  Result Date: 02/08/2019 CLINICAL DATA:  Chest pain EXAM: CHEST - 2 VIEW COMPARISON:  09/19/2018 FINDINGS: The heart size and mediastinal contours are within normal limits. Both lungs are clear. The visualized skeletal structures are unremarkable. IMPRESSION: No acute abnormality of the lungs. Electronically Signed   By: Lauralyn PrimesAlex  Bibbey M.D.   On: 02/08/2019 21:10    _______________________________________ Procedures   ____________________________________________   INITIAL IMPRESSION / MDM / ASSESSMENT AND PLAN / ED COURSE  As part of my medical decision making, I reviewed the following data within the electronic MEDICAL RECORD NUMBER   [age] year old female presented with above-stated history and physical exam consistent with possible fibrocystic breast.  Patient will be prescribed ibuprofen and referred to OB/GYN for further outpatient evaluation.  ____________________________________________  FINAL CLINICAL IMPRESSION(S) / ED DIAGNOSES  Final diagnoses:  Breast  pain  Fibrocystic disease of left breast     MEDICATIONS GIVEN DURING THIS VISIT:  Medications - No data to display   ED Discharge Orders    None      *Please note:  Joya GaskinsQuinesha Makyia Brue was evaluated in Emergency Department on 02/09/2019 for the symptoms described in the history of present illness. She was evaluated in the context of the global COVID-19 pandemic, which necessitated consideration that the patient might be at risk for infection with the SARS-CoV-2 virus that causes COVID-19. Institutional protocols and algorithms that pertain to the evaluation of patients at risk for COVID-19 are in a state of rapid change based on information released by regulatory bodies including the CDC and federal and state organizations. These policies and algorithms were followed during the patient's care in the ED.  Some ED evaluations and interventions may be delayed as a result of limited staffing during the pandemic.*  Note:  This document was prepared using Dragon voice recognition software and may include unintentional dictation errors.   Darci CurrentBrown, [place] N, MD 02/09/19 [phone]

## 2019-02-13 ENCOUNTER — Encounter: Payer: Self-pay | Admitting: Obstetrics and Gynecology

## 2019-02-13 ENCOUNTER — Other Ambulatory Visit: Payer: Self-pay

## 2019-02-13 ENCOUNTER — Ambulatory Visit (INDEPENDENT_AMBULATORY_CARE_PROVIDER_SITE_OTHER): Payer: Medicaid Other | Admitting: Obstetrics and Gynecology

## 2019-02-13 VITALS — BP 116/75 | HR 73 | Wt 212.8 lb

## 2019-02-13 DIAGNOSIS — R0789 Other chest pain: Secondary | ICD-10-CM

## 2019-02-13 DIAGNOSIS — R079 Chest pain, unspecified: Secondary | ICD-10-CM

## 2019-02-13 NOTE — Progress Notes (Signed)
HPI:      [name] is a 18 y.o. No obstetric history on file. who LMP was Patient's last menstrual period was 01/16/2019 (approximate).  Subjective:   She presents today as a follow-up on the emergency department.  She states that she has had breast lumps and pain over the last several years.  She says it was significantly worse last week when she went to the ED.  They found no issues with her breast and suggested that it was possibly related to her musculoskeletal system. Since that time the pain has improved and is better with Tylenol use. Of significant note patient has normal regular cycles.  She is not using anything for birth control. She states that her history of headaches resolved this and she had her Implanon removed    Hx: The following portions of the patient's history were reviewed and updated as appropriate:             She  has a past medical history of Degenerative joint disease, IBS (irritable bowel syndrome), Migraine, and Scoliosis. She does not have any pertinent problems on file. She  has a past surgical history that includes Foot surgery. Her family history is not on file. She  reports that she has been smoking. She has been smoking about 0.10 packs per day. She has never used smokeless tobacco. She reports current alcohol use. She reports current drug use. Drug: Marijuana. She has a current medication list which includes the following prescription(s): ibuprofen, etonogestrel-ethinyl estradiol, and ondansetron. She is allergic to clindamycin/lincomycin and penicillins.       Review of Systems:  Review of Systems  Constitutional: Denied constitutional symptoms, night sweats, recent illness, fatigue, fever, insomnia and weight loss.  Eyes: Denied eye symptoms, eye pain, photophobia, vision change and visual disturbance.  Ears/Nose/Throat/Neck: Denied ear, nose, throat or neck symptoms, hearing loss, nasal discharge, sinus congestion and sore throat.   Cardiovascular: Denied cardiovascular symptoms, arrhythmia, chest pain/pressure, edema, exercise intolerance, orthopnea and palpitations.  Respiratory: Denied pulmonary symptoms, asthma, pleuritic pain, productive sputum, cough, dyspnea and wheezing.  Gastrointestinal: Denied, gastro-esophageal reflux, melena, nausea and vomiting.  Genitourinary: See HPI for additional information.  Musculoskeletal: Denied musculoskeletal symptoms, stiffness, swelling, muscle weakness and myalgia.  Dermatologic: Denied dermatology symptoms, rash and scar.  Neurologic: Denied neurology symptoms, dizziness, headache, neck pain and syncope.  Psychiatric: Denied psychiatric symptoms, anxiety and depression.  Endocrine: Denied endocrine symptoms including hot flashes and night sweats.   Meds:   Current Outpatient Medications on File Prior to Visit  Medication Sig Dispense Refill  . ibuprofen (ADVIL) 800 MG tablet Take 1 tablet (800 mg total) by mouth every 8 (eight) hours as needed. 30 tablet 0  . etonogestrel-ethinyl estradiol (NUVARING) 0.12-0.015 MG/24HR vaginal ring Insert vaginally and leave in place for 3 consecutive weeks, then remove for 1 week. (Patient not taking: Reported on 02/13/2019) 3 each 3  . ondansetron (ZOFRAN ODT) 4 MG disintegrating tablet Take 1 tablet (4 mg total) by mouth every 8 (eight) hours as needed. (Patient not taking: Reported on 02/13/2019) 20 tablet 0   No current facility-administered medications on file prior to visit.     Objective:     Vitals:   02/13/19 1131  BP: 116/75  Pulse: 73              Bilateral breast examination reveals no dominant masses no axillary adenopathy no nipple discharge.  In conjunction with the patient we have localized the pain she is  having to an area that is not over lied by breast.  Her pain is localized to ribs and costochondral junction.  No masses are palpated in this area and the pain can be reproduced by gentle palpation.  Assessment:     No obstetric history on file. Patient Active Problem List   Diagnosis Date Noted  . 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 Chest pain of uncertain etiology     Likely musculoskeletal in nature-costochondral junction specifically.   Plan:            1.  Recommend use of NSAID S in lieu of Tylenol.  Rest from active work using pectoralis muscles. Orders No orders of the defined types were placed in this encounter.   No orders of the defined types were placed in this encounter.     F/U  Return for Pt to contact us if symptoms worsen. I spent 22 minutes involved in the care of this patient of which greater than 50% was spent discussing chest pain, breast issues, cycle issues, use of OCPs and other forms of birth control.  All questions answered.  Elonda Huskyavid J. Evans, M.D. 02/13/2019 12:05 PM

## 2019-02-13 NOTE — Progress Notes (Signed)
Patient comes in today for ED follow up. She has knots in breast that causes pain.

## 2019-02-14 ENCOUNTER — Ambulatory Visit: Payer: Medicaid Other

## 2019-02-15 ENCOUNTER — Ambulatory Visit: Payer: Medicaid Other

## 2019-09-26 IMAGING — DX LEFT FOOT - COMPLETE 3+ VIEW
3 series · 3 of 3 positions shown · non-contrast
Comparison: None.

CLINICAL DATA: Foot injury

EXAM:
LEFT FOOT - COMPLETE 3+ VIEW

[foot ap]
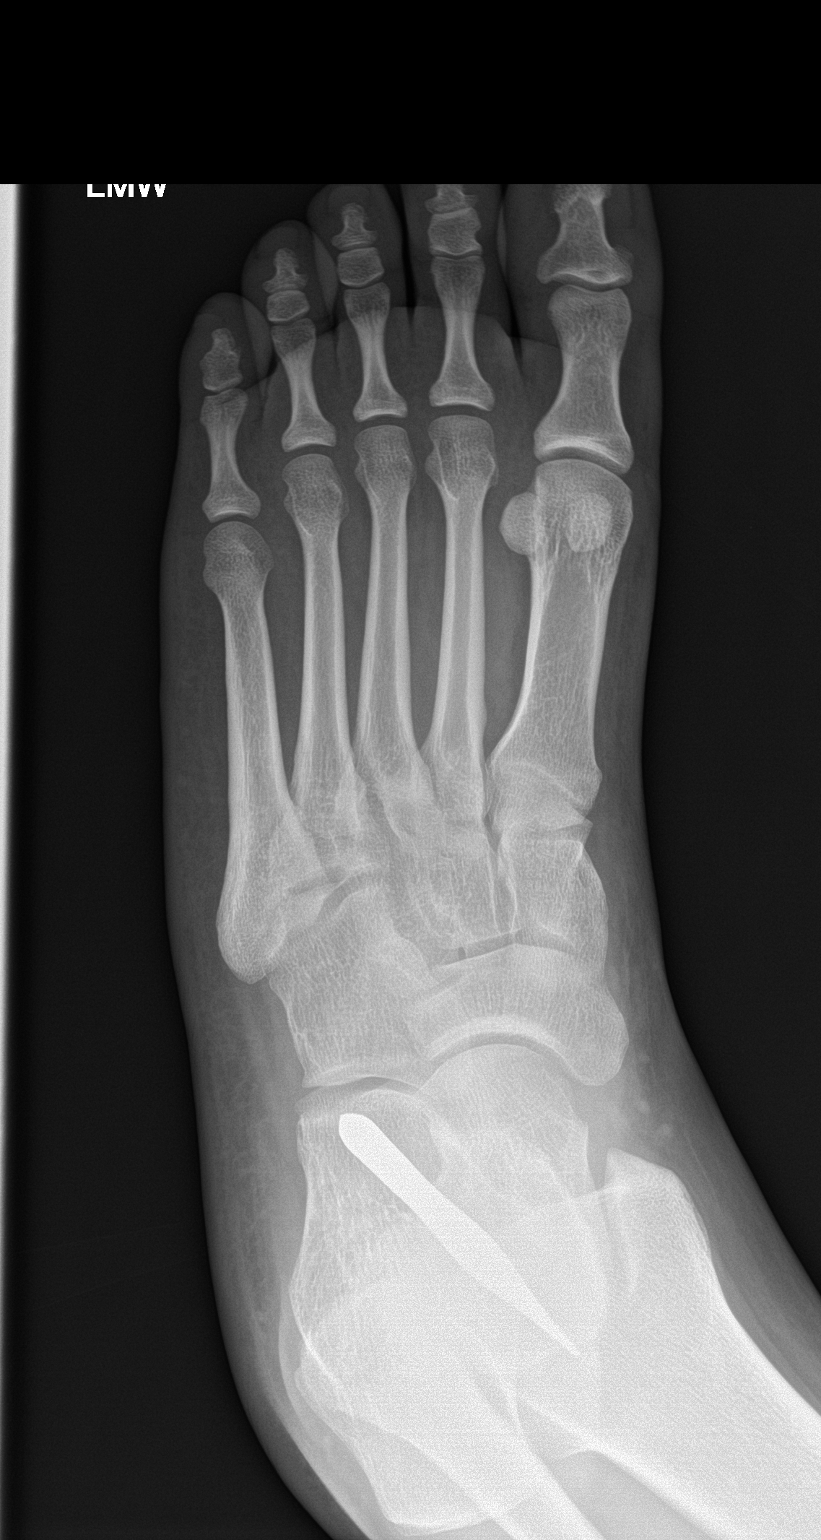

[foot obl]
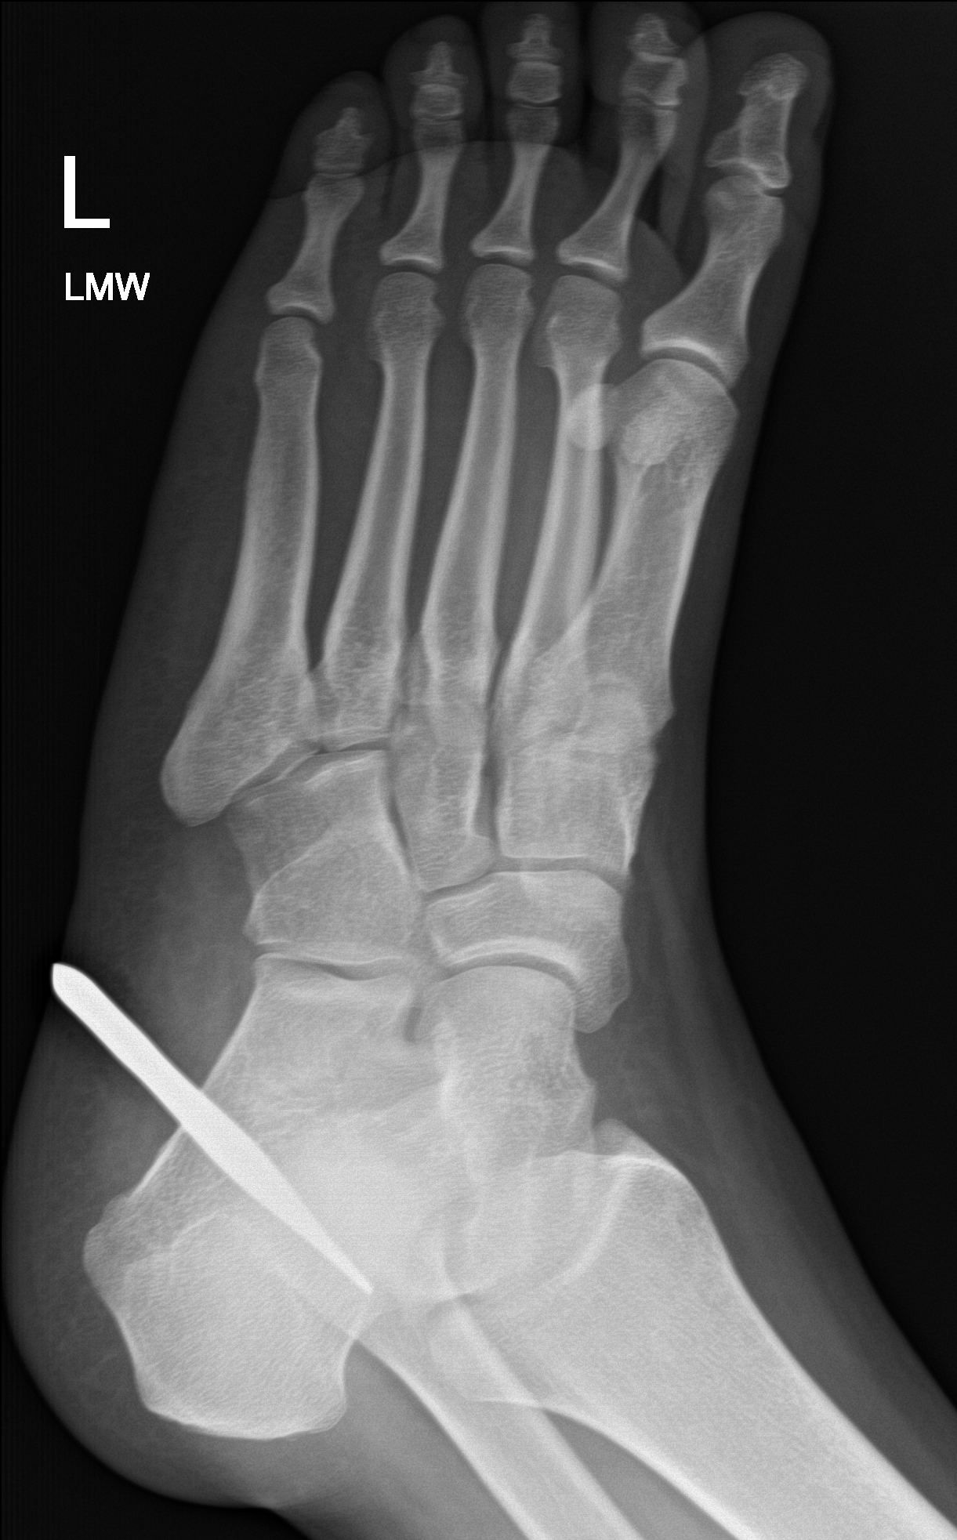

[foot lat]
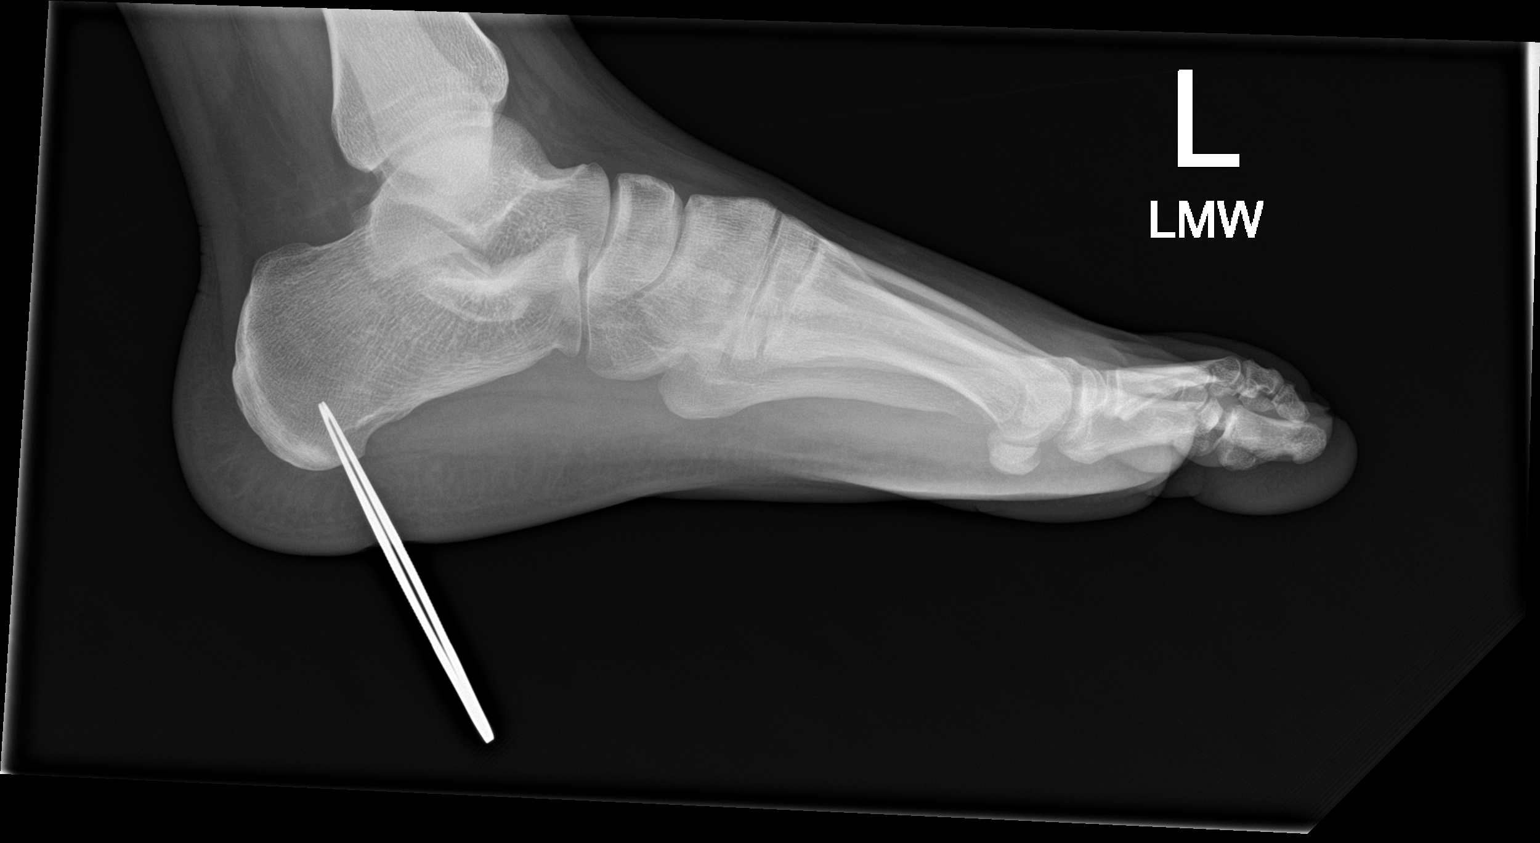

[3 of 3 positions shown; findings below may reference images not displayed]

FINDINGS: No fracture or malalignment. Metallic foreign body overlying the
posterior, inferior calcaneus.
IMPRESSION: No acute osseous abnormality. Metallic foreign object with tip
projecting over the posterior, plantar aspect of the calcaneus
